# Patient Record
Sex: Female | Born: 1945 | Hispanic: No | Marital: Married
Health system: Southern US, Community
[De-identification: ages and names within clinical notes are randomized; demographics above are authoritative.]

---

## 2003-11-24 ENCOUNTER — Encounter: Admission: RE | Admit: 2003-11-24 | Discharge: 2003-11-24 | Payer: Self-pay | Admitting: Family Medicine

## 2003-12-02 ENCOUNTER — Encounter: Admission: RE | Admit: 2003-12-02 | Discharge: 2003-12-02 | Payer: Self-pay | Admitting: Family Medicine

## 2003-12-02 ENCOUNTER — Encounter (INDEPENDENT_AMBULATORY_CARE_PROVIDER_SITE_OTHER): Payer: Self-pay | Admitting: *Deleted

## 2003-12-17 ENCOUNTER — Ambulatory Visit: Admission: RE | Admit: 2003-12-17 | Discharge: 2003-12-17 | Payer: Self-pay | Admitting: General Surgery

## 2003-12-17 ENCOUNTER — Ambulatory Visit (HOSPITAL_BASED_OUTPATIENT_CLINIC_OR_DEPARTMENT_OTHER): Admission: RE | Admit: 2003-12-17 | Discharge: 2003-12-17 | Payer: Self-pay | Admitting: General Surgery

## 2004-01-07 ENCOUNTER — Encounter: Admission: RE | Admit: 2004-01-07 | Discharge: 2004-01-07 | Payer: Self-pay | Admitting: General Surgery

## 2004-02-10 ENCOUNTER — Encounter (INDEPENDENT_AMBULATORY_CARE_PROVIDER_SITE_OTHER): Payer: Self-pay | Admitting: *Deleted

## 2004-02-10 ENCOUNTER — Ambulatory Visit (HOSPITAL_COMMUNITY): Admission: RE | Admit: 2004-02-10 | Discharge: 2004-02-10 | Payer: Self-pay | Admitting: General Surgery

## 2004-02-10 ENCOUNTER — Encounter: Admission: RE | Admit: 2004-02-10 | Discharge: 2004-02-10 | Payer: Self-pay | Admitting: General Surgery

## 2008-02-05 ENCOUNTER — Encounter: Admission: RE | Admit: 2008-02-05 | Discharge: 2008-02-05 | Payer: Self-pay | Admitting: Family Medicine

## 2009-04-21 ENCOUNTER — Encounter: Admission: RE | Admit: 2009-04-21 | Discharge: 2009-04-21 | Payer: Self-pay | Admitting: Family Medicine

## 2010-02-07 ENCOUNTER — Encounter: Admission: RE | Admit: 2010-02-07 | Discharge: 2010-02-07 | Payer: Self-pay | Admitting: Family Medicine

## 2010-05-17 ENCOUNTER — Encounter: Admission: RE | Admit: 2010-05-17 | Discharge: 2010-05-17 | Payer: Self-pay | Admitting: Family Medicine

## 2010-07-17 ENCOUNTER — Encounter: Payer: Self-pay | Admitting: General Surgery

## 2010-11-11 NOTE — Op Note (Signed)
NAMEFLORINE, SPRENKLE NO.:  [id number]   MEDICAL RECORD NO.:  [id number]                   PATIENT TYPE:  OIB   LOCATION:  2899                                 FACILITY:  [facility]   PHYSICIAN:  Ollen Gross. Vernell Morgans, M.D.              DATE OF BIRTH:  11-14-1945   DATE OF PROCEDURE:  02/10/2004  DATE OF DISCHARGE:  02/10/2004                                 OPERATIVE REPORT   PREOPERATIVE DIAGNOSIS:  Left breast calcifications.   POSTOPERATIVE DIAGNOSIS:  Left breast calcifications.   OPERATION/PROCEDURE:  Left breast needle-localized biopsy.   SURGEON:  Ollen Gross. Carolynne Edouard, M.D.   ANESTHESIA:  General via LMA.   DESCRIPTION OF PROCEDURE:  After informed consent was obtained, the patient  was brought to the operating room and placed in the supine position on the  operating table.  After adequate dose of general anesthesia, the patient's  left breast was prepped with Betadine and draped in the usual sterile  manner.  The localizing wire was brought in laterally and the calcifications  were just around the wire a couple centimeters deep into the skin.  A  transverse type incision was made including the wire. The skin around the  wire was ellipsed out with the specimen.  The dissection was then carried  down  through the rest of the fatty tissue sharply with the electrocautery.  The specimen and the area in question was around the mid shaft of the wire.  The dissection was carried superiorly, inferiorly, and mediolaterally deep  and deep until the tip of the wire was identified.  Good margin both on all  sides was obtained and the specimen was sharply removed from the patient by  360-degrees dissection with the Bovie electrocautery.  Once the specimen was  removed, it was oriented with a margin map and sent to radiology for x-ray  to make sure the calcifications in question were within the specimen.  The  wound was then examined and found to be hemostatic. The  radiologist called  back and said that the calcifications in question were in the specimen.  The  wound was then irrigated with a copious amounts of saline.  The skin was  then closed with a running 4-0 Monocryl subcuticular stitch.  Benzoin and  Steri-Strips and sterile dressings were applied.  The patient tolerated the  procedure well.  Sponge and needle counts were correct.  The patient was  then awakened and taken to the recovery room in stable condition.                                               Ollen Gross. Vernell Morgans, M.D.    PST/MEDQ  D:  02/10/2004  T:  02/10/2004  Job:  [id number]

## 2011-06-07 ENCOUNTER — Other Ambulatory Visit: Payer: Self-pay | Admitting: Family Medicine

## 2011-06-07 DIAGNOSIS — Z1231 Encounter for screening mammogram for malignant neoplasm of breast: Secondary | ICD-10-CM

## 2011-07-11 ENCOUNTER — Ambulatory Visit
Admission: RE | Admit: 2011-07-11 | Discharge: 2011-07-11 | Disposition: A | Discharge status: Home / Self Care | Payer: BC Managed Care – PPO | Source: Ambulatory Visit | Attending: Family Medicine | Admitting: Family Medicine

## 2011-07-11 DIAGNOSIS — Z1231 Encounter for screening mammogram for malignant neoplasm of breast: Secondary | ICD-10-CM

## 2012-10-01 ENCOUNTER — Other Ambulatory Visit: Payer: Self-pay

## 2012-10-01 DIAGNOSIS — Z1231 Encounter for screening mammogram for malignant neoplasm of breast: Secondary | ICD-10-CM

## 2012-10-18 ENCOUNTER — Ambulatory Visit
Admission: RE | Admit: 2012-10-18 | Discharge: 2012-10-18 | Disposition: A | Discharge status: Home / Self Care | Payer: BC Managed Care – PPO | Source: Ambulatory Visit

## 2012-10-18 DIAGNOSIS — Z1231 Encounter for screening mammogram for malignant neoplasm of breast: Secondary | ICD-10-CM

## 2012-12-18 ENCOUNTER — Other Ambulatory Visit: Payer: Self-pay | Admitting: Family Medicine

## 2012-12-18 DIAGNOSIS — M810 Age-related osteoporosis without current pathological fracture: Secondary | ICD-10-CM

## 2012-12-30 ENCOUNTER — Other Ambulatory Visit: Payer: BC Managed Care – PPO

## 2013-01-01 ENCOUNTER — Ambulatory Visit
Admission: RE | Admit: 2013-01-01 | Discharge: 2013-01-01 | Disposition: A | Discharge status: Home / Self Care | Payer: Medicare Other | Source: Ambulatory Visit | Attending: Family Medicine | Admitting: Family Medicine

## 2013-01-01 DIAGNOSIS — M810 Age-related osteoporosis without current pathological fracture: Secondary | ICD-10-CM

## 2014-02-18 ENCOUNTER — Other Ambulatory Visit: Payer: Self-pay

## 2014-02-18 DIAGNOSIS — Z1231 Encounter for screening mammogram for malignant neoplasm of breast: Secondary | ICD-10-CM

## 2014-02-25 ENCOUNTER — Ambulatory Visit
Admission: RE | Admit: 2014-02-25 | Discharge: 2014-02-25 | Disposition: A | Discharge status: Home / Self Care | Payer: Medicare Other | Source: Ambulatory Visit

## 2014-02-25 DIAGNOSIS — Z1231 Encounter for screening mammogram for malignant neoplasm of breast: Secondary | ICD-10-CM

## 2015-01-26 ENCOUNTER — Other Ambulatory Visit: Payer: Self-pay

## 2015-01-26 DIAGNOSIS — Z1231 Encounter for screening mammogram for malignant neoplasm of breast: Secondary | ICD-10-CM

## 2015-04-30 ENCOUNTER — Ambulatory Visit
Admission: RE | Admit: 2015-04-30 | Discharge: 2015-04-30 | Disposition: A | Discharge status: Home / Self Care | Payer: Medicare Other | Source: Ambulatory Visit

## 2015-04-30 DIAGNOSIS — Z1231 Encounter for screening mammogram for malignant neoplasm of breast: Secondary | ICD-10-CM

## 2015-05-10 ENCOUNTER — Telehealth (HOSPITAL_COMMUNITY): Payer: Self-pay | Admitting: *Deleted

## 2015-05-12 ENCOUNTER — Telehealth (HOSPITAL_COMMUNITY): Payer: Self-pay | Admitting: *Deleted

## 2015-05-13 ENCOUNTER — Other Ambulatory Visit (HOSPITAL_COMMUNITY): Payer: Self-pay | Admitting: Family Medicine

## 2015-05-13 DIAGNOSIS — R011 Cardiac murmur, unspecified: Secondary | ICD-10-CM

## 2015-05-18 ENCOUNTER — Ambulatory Visit (HOSPITAL_COMMUNITY): Payer: Medicare Other | Attending: Internal Medicine

## 2015-05-18 ENCOUNTER — Other Ambulatory Visit: Payer: Self-pay

## 2015-05-18 DIAGNOSIS — I5189 Other ill-defined heart diseases: Secondary | ICD-10-CM | POA: Diagnosis not present

## 2015-05-18 DIAGNOSIS — I358 Other nonrheumatic aortic valve disorders: Secondary | ICD-10-CM | POA: Diagnosis not present

## 2015-05-18 DIAGNOSIS — I071 Rheumatic tricuspid insufficiency: Secondary | ICD-10-CM | POA: Insufficient documentation

## 2015-05-18 DIAGNOSIS — R011 Cardiac murmur, unspecified: Secondary | ICD-10-CM | POA: Insufficient documentation

## 2016-05-04 ENCOUNTER — Other Ambulatory Visit: Payer: Self-pay | Admitting: Family Medicine

## 2016-05-04 DIAGNOSIS — Z1231 Encounter for screening mammogram for malignant neoplasm of breast: Secondary | ICD-10-CM

## 2016-05-10 ENCOUNTER — Ambulatory Visit
Admission: RE | Admit: 2016-05-10 | Discharge: 2016-05-10 | Disposition: A | Discharge status: Home / Self Care | Payer: Medicare Other | Source: Ambulatory Visit | Attending: Family Medicine | Admitting: Family Medicine

## 2016-05-10 DIAGNOSIS — Z1231 Encounter for screening mammogram for malignant neoplasm of breast: Secondary | ICD-10-CM

## 2017-02-07 ENCOUNTER — Other Ambulatory Visit: Payer: Self-pay | Admitting: Family Medicine

## 2017-02-07 DIAGNOSIS — M810 Age-related osteoporosis without current pathological fracture: Secondary | ICD-10-CM

## 2017-09-11 ENCOUNTER — Other Ambulatory Visit: Payer: Self-pay | Admitting: Nurse Practitioner

## 2017-09-11 DIAGNOSIS — Z1231 Encounter for screening mammogram for malignant neoplasm of breast: Secondary | ICD-10-CM

## 2017-10-05 ENCOUNTER — Ambulatory Visit: Payer: Medicare Other

## 2017-10-08 ENCOUNTER — Ambulatory Visit
Admission: RE | Admit: 2017-10-08 | Discharge: 2017-10-08 | Disposition: A | Discharge status: Home / Self Care | Payer: Medicare Other | Source: Ambulatory Visit | Attending: Nurse Practitioner | Admitting: Nurse Practitioner

## 2017-10-08 DIAGNOSIS — Z1231 Encounter for screening mammogram for malignant neoplasm of breast: Secondary | ICD-10-CM

## 2018-12-20 ENCOUNTER — Other Ambulatory Visit: Payer: Self-pay | Admitting: Family Medicine

## 2018-12-20 DIAGNOSIS — Z1231 Encounter for screening mammogram for malignant neoplasm of breast: Secondary | ICD-10-CM

## 2019-02-06 ENCOUNTER — Ambulatory Visit
Admission: RE | Admit: 2019-02-06 | Discharge: 2019-02-06 | Disposition: A | Discharge status: Home / Self Care | Payer: Medicare Other | Source: Ambulatory Visit | Attending: Family Medicine | Admitting: Family Medicine

## 2019-02-06 ENCOUNTER — Other Ambulatory Visit: Payer: Self-pay

## 2019-02-06 DIAGNOSIS — Z1231 Encounter for screening mammogram for malignant neoplasm of breast: Secondary | ICD-10-CM

## 2019-07-24 ENCOUNTER — Ambulatory Visit: Payer: Medicare Other

## 2019-07-31 ENCOUNTER — Ambulatory Visit: Payer: Medicare Other | Attending: Internal Medicine

## 2019-07-31 DIAGNOSIS — Z23 Encounter for immunization: Secondary | ICD-10-CM | POA: Insufficient documentation

## 2019-07-31 NOTE — Progress Notes (Signed)
   Covid-19 Vaccination Clinic  Name:  [name]    MRN: [id number] DOB: 1946/05/16  07/31/2019  [name] was observed post Covid-19 immunization for 15 minutes without incidence. She was provided with Vaccine Information Sheet and instruction to access the V-Safe system.   [name] was instructed to call 911 with any severe reactions post vaccine: Marland Kitchen Difficulty breathing  . Swelling of your face and throat  . A fast heartbeat  . A bad rash all over your body  . Dizziness and weakness    Immunizations Administered    Name Date Dose VIS Date Route   Pfizer COVID-19 Vaccine 07/31/2019  1:22 PM 0.3 mL 06/06/2019 Intramuscular   Manufacturer: [employer], [employer]   Lot: [id number]   NDC: [address]-8

## 2019-08-10 ENCOUNTER — Ambulatory Visit: Payer: Medicare Other

## 2019-08-26 ENCOUNTER — Ambulatory Visit: Payer: Medicare Other | Attending: Internal Medicine

## 2019-08-26 DIAGNOSIS — Z23 Encounter for immunization: Secondary | ICD-10-CM

## 2019-08-26 NOTE — Progress Notes (Signed)
   Covid-19 Vaccination Clinic  Name:  [name]    MRN: [id number] DOB: 1946-02-15  08/26/2019  [name] was observed post Covid-19 immunization for 15 minutes without incident. She was provided with Vaccine Information Sheet and instruction to access the V-Safe system.   [name] was instructed to call 911 with any severe reactions post vaccine: Marland Kitchen Difficulty breathing  . Swelling of face and throat  . A fast heartbeat  . A bad rash all over body  . Dizziness and weakness   Immunizations Administered    Name Date Dose VIS Date Route   Pfizer COVID-19 Vaccine 08/26/2019  9:45 AM 0.3 mL 06/06/2019 Intramuscular   Manufacturer: [employer], [employer]   Lot: [id number]   NDC: [address]-3

## 2020-03-15 ENCOUNTER — Other Ambulatory Visit: Payer: Self-pay | Admitting: Family Medicine

## 2020-03-15 DIAGNOSIS — Z1231 Encounter for screening mammogram for malignant neoplasm of breast: Secondary | ICD-10-CM

## 2020-03-17 ENCOUNTER — Other Ambulatory Visit: Payer: Self-pay

## 2020-03-17 ENCOUNTER — Ambulatory Visit
Admission: RE | Admit: 2020-03-17 | Discharge: 2020-03-17 | Disposition: A | Discharge status: Home / Self Care | Payer: Medicare Other | Source: Ambulatory Visit | Attending: Family Medicine | Admitting: Family Medicine

## 2020-03-17 DIAGNOSIS — Z1231 Encounter for screening mammogram for malignant neoplasm of breast: Secondary | ICD-10-CM

## 2021-03-31 ENCOUNTER — Other Ambulatory Visit: Payer: Self-pay | Admitting: Family Medicine

## 2021-03-31 DIAGNOSIS — Z1231 Encounter for screening mammogram for malignant neoplasm of breast: Secondary | ICD-10-CM

## 2021-04-22 ENCOUNTER — Ambulatory Visit
Admission: RE | Admit: 2021-04-22 | Discharge: 2021-04-22 | Disposition: A | Discharge status: Home / Self Care | Payer: Medicare Other | Source: Ambulatory Visit | Attending: Family Medicine | Admitting: Family Medicine

## 2021-04-22 ENCOUNTER — Other Ambulatory Visit: Payer: Self-pay

## 2021-04-22 DIAGNOSIS — Z1231 Encounter for screening mammogram for malignant neoplasm of breast: Secondary | ICD-10-CM

## 2021-09-04 IMAGING — MG DIGITAL SCREENING BILAT W/ TOMO W/ CAD
8 series · 8 of 24 positions shown · non-contrast
Comparison: Previous exam(s).

CLINICAL DATA: Screening.

EXAM:
DIGITAL SCREENING BILATERAL MAMMOGRAM WITH TOMO AND CAD

[L CC synth-2D]
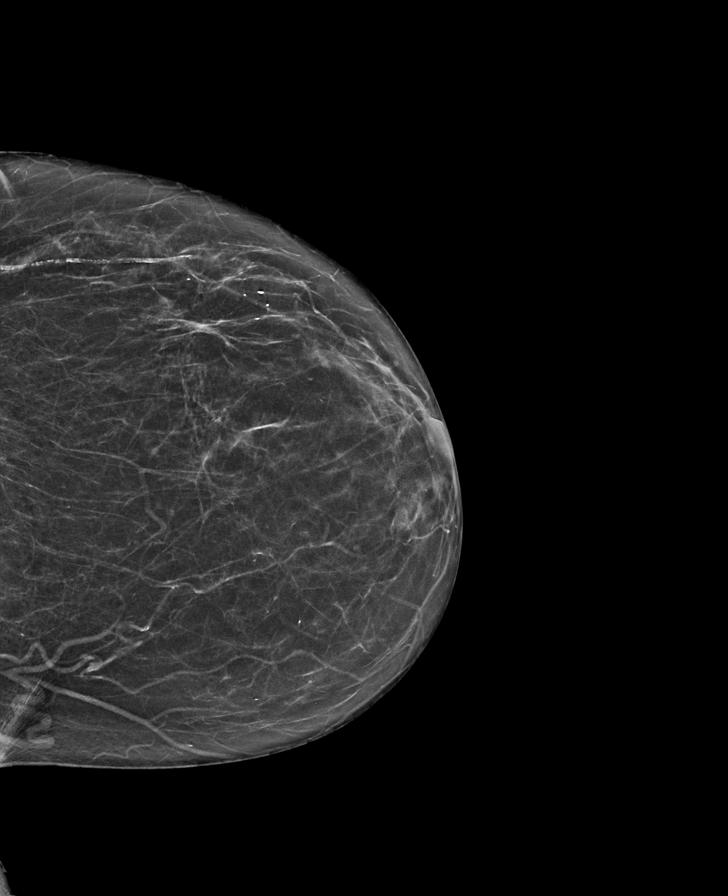

[R MLO synth-2D]
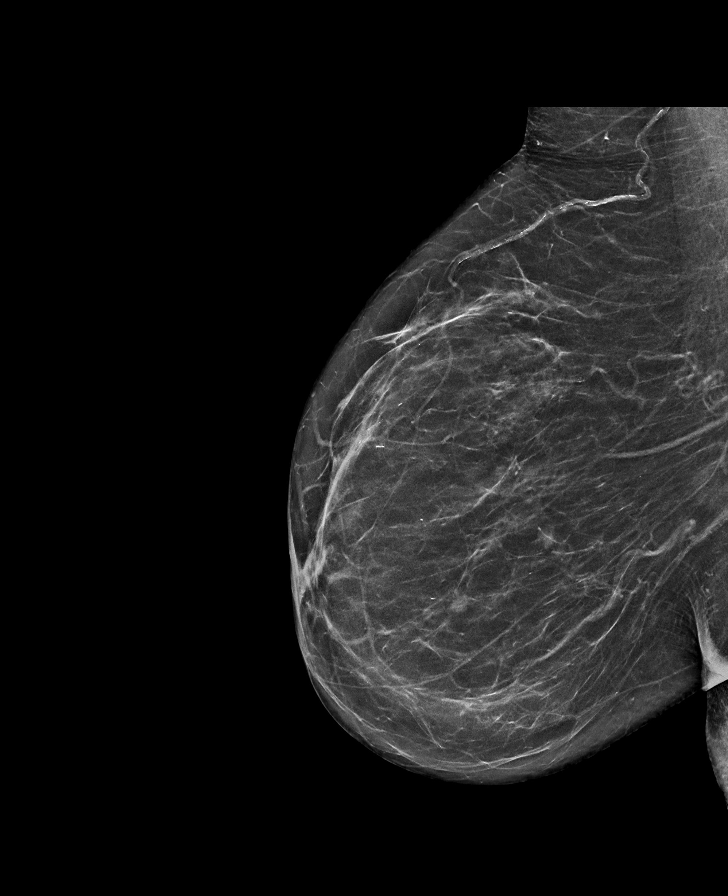

[R CC synth-2D]
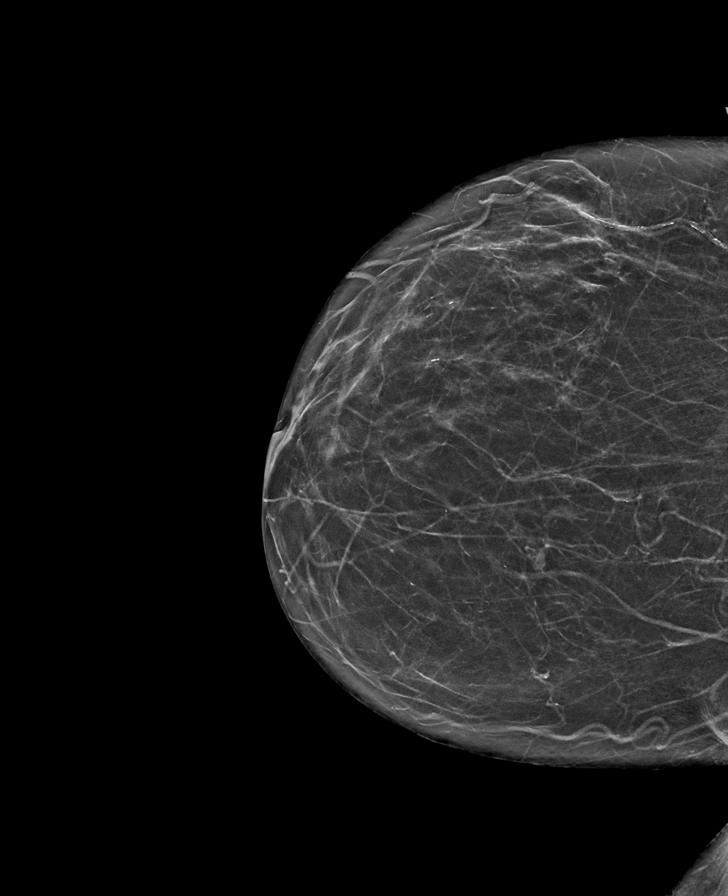

[L MLO synth-2D]
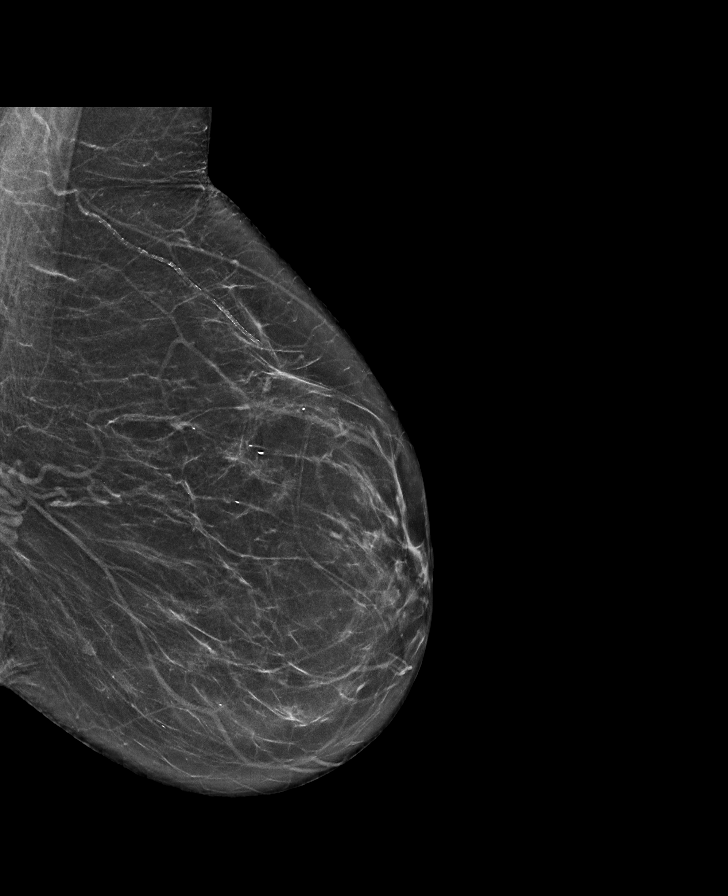

[R MLO tomo · tomo slice 33/65.0]
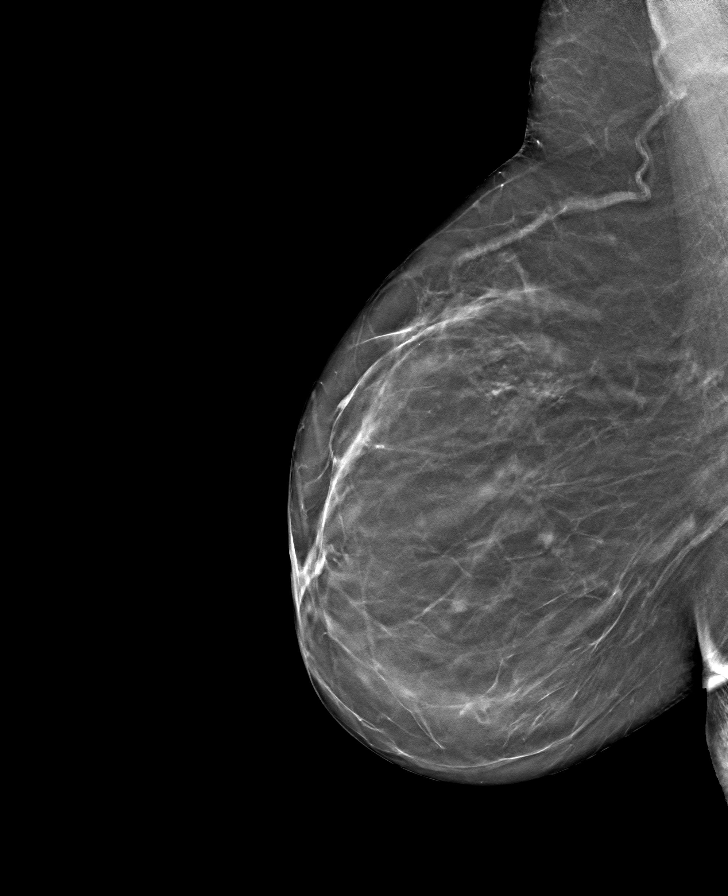

[R CC tomo · tomo slice 29/58.0]
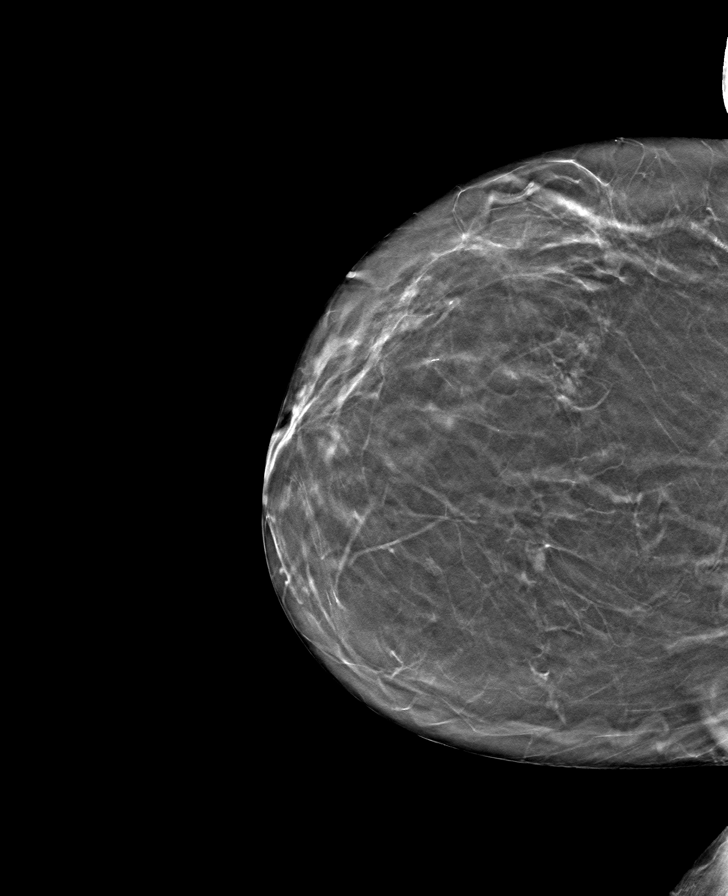

[L CC tomo · tomo slice 29/58.0]
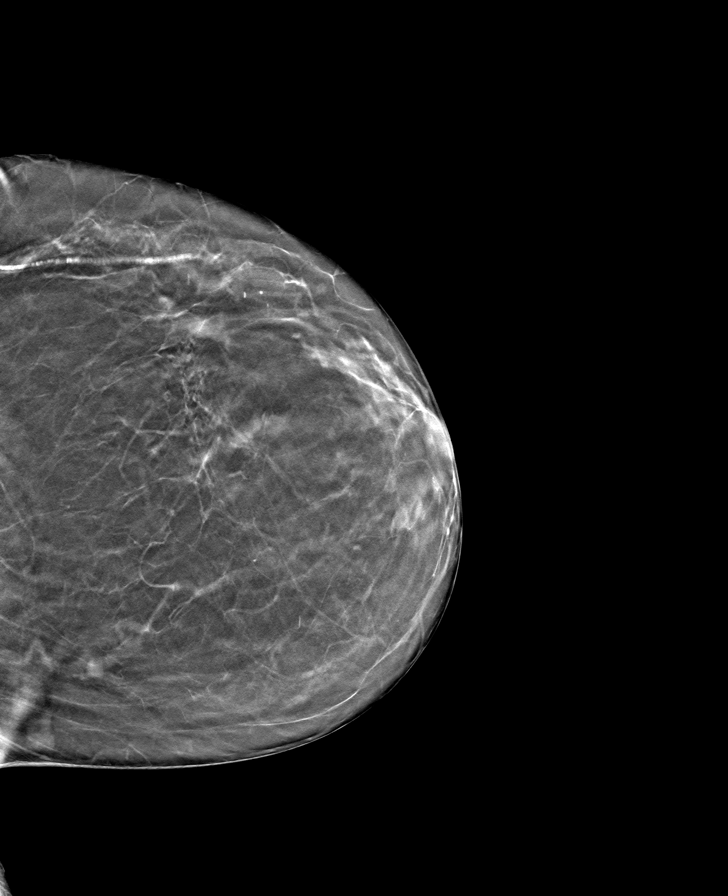

[L MLO tomo · tomo slice 33/66.0]
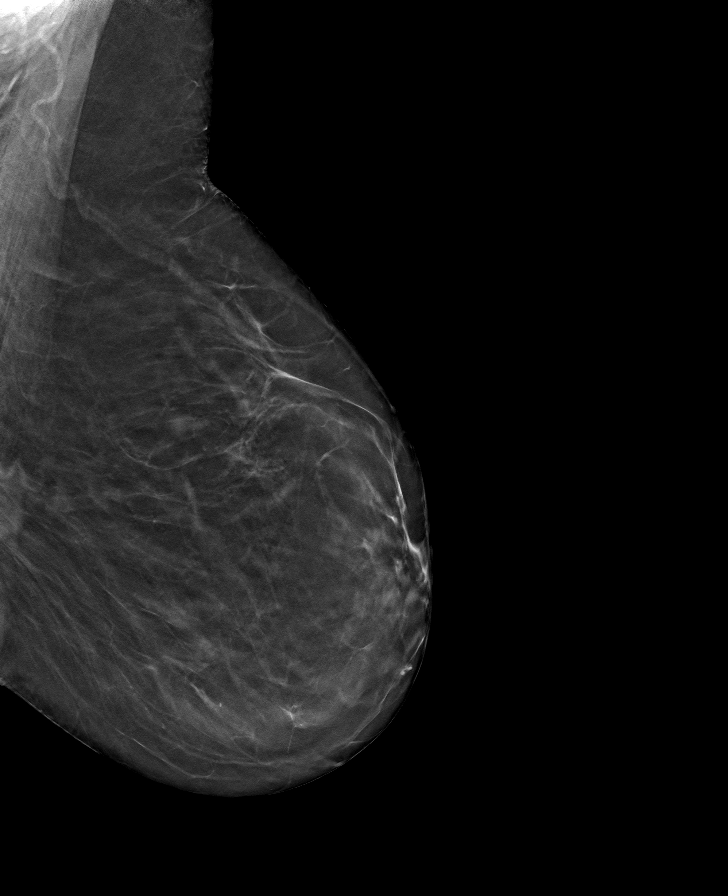

[8 of 24 positions shown; findings below may reference images not displayed]

ACR Breast Density Category b: There are scattered areas of
fibroglandular density.
FINDINGS: There are no findings suspicious for malignancy. Images were
processed with CAD.
IMPRESSION: No mammographic evidence of malignancy. A result letter of this
screening mammogram will be mailed directly to the patient.

RECOMMENDATION:
Screening mammogram in one year. (Code:[id number])

BI-RADS CATEGORY  1: Negative.

## 2021-11-25 LAB — EXTERNAL GENERIC LAB PROCEDURE: COLOGUARD: POSITIVE — AB

## 2022-05-23 ENCOUNTER — Other Ambulatory Visit: Payer: Self-pay | Admitting: Physician Assistant

## 2022-05-23 DIAGNOSIS — Z1231 Encounter for screening mammogram for malignant neoplasm of breast: Secondary | ICD-10-CM

## 2022-05-25 ENCOUNTER — Ambulatory Visit
Admission: RE | Admit: 2022-05-25 | Discharge: 2022-05-25 | Disposition: A | Discharge status: Home / Self Care | Payer: Medicare Other | Source: Ambulatory Visit | Attending: Physician Assistant | Admitting: Physician Assistant

## 2022-05-25 DIAGNOSIS — Z1231 Encounter for screening mammogram for malignant neoplasm of breast: Secondary | ICD-10-CM

## 2023-06-07 ENCOUNTER — Other Ambulatory Visit: Payer: Self-pay | Admitting: Family Medicine

## 2023-06-07 DIAGNOSIS — Z1231 Encounter for screening mammogram for malignant neoplasm of breast: Secondary | ICD-10-CM

## 2023-06-15 ENCOUNTER — Ambulatory Visit
Admission: RE | Admit: 2023-06-15 | Discharge: 2023-06-15 | Disposition: A | Discharge status: Home / Self Care | Payer: Medicare Other | Source: Ambulatory Visit | Attending: Family Medicine | Admitting: Family Medicine

## 2023-06-15 DIAGNOSIS — Z1231 Encounter for screening mammogram for malignant neoplasm of breast: Secondary | ICD-10-CM
